# Patient Record
Sex: Male | Born: 1985 | Race: Black or African American | Hispanic: No | Marital: Single | Smoking: Former smoker
Health system: Southern US, Community
[De-identification: ages and names within clinical notes are randomized; demographics above are authoritative.]

---

## 2016-01-09 ENCOUNTER — Emergency Department: Payer: BLUE CROSS/BLUE SHIELD

## 2016-01-09 ENCOUNTER — Emergency Department
Admission: EM | Admit: 2016-01-09 | Discharge: 2016-01-09 | Disposition: A | Discharge status: Home / Self Care | Payer: BLUE CROSS/BLUE SHIELD | Attending: Emergency Medicine | Admitting: Emergency Medicine

## 2016-01-09 ENCOUNTER — Encounter: Payer: Self-pay | Admitting: *Deleted

## 2016-01-09 DIAGNOSIS — K219 Gastro-esophageal reflux disease without esophagitis: Secondary | ICD-10-CM | POA: Diagnosis not present

## 2016-01-09 DIAGNOSIS — Z87891 Personal history of nicotine dependence: Secondary | ICD-10-CM | POA: Diagnosis not present

## 2016-01-09 DIAGNOSIS — R079 Chest pain, unspecified: Secondary | ICD-10-CM | POA: Diagnosis not present

## 2016-01-09 LAB — BASIC METABOLIC PANEL
Anion gap: 8 (ref 5–15)
BUN: 8 mg/dL (ref 6–20)
CHLORIDE: 105 mmol/L (ref 101–111)
CO2: 27 mmol/L (ref 22–32)
Calcium: 9.4 mg/dL (ref 8.9–10.3)
Creatinine, Ser: 0.73 mg/dL (ref 0.61–1.24)
GFR calc non Af Amer: >60 mL/min (ref >60)
GLUCOSE: 86 mg/dL (ref 65–99)
Potassium: 3.6 mmol/L (ref 3.5–5.1)
Sodium: 140 mmol/L (ref 135–145)

## 2016-01-09 LAB — CBC
HEMATOCRIT: 40.1 % (ref 40.0–52.0)
HEMOGLOBIN: 13.6 g/dL (ref 13.0–18.0)
MCH: 31.7 pg (ref 26.0–34.0)
MCHC: 33.8 g/dL (ref 32.0–36.0)
MCV: 93.6 fL (ref 80.0–100.0)
Platelets: 301 10*3/uL (ref 150–440)
RBC: 4.28 MIL/uL — ABNORMAL LOW (ref 4.40–5.90)
RDW: 12.1 % (ref 11.5–14.5)
WBC: 8.4 10*3/uL (ref 3.8–10.6)

## 2016-01-09 LAB — TROPONIN I: Troponin I: <0.03 ng/mL (ref <0.031)

## 2016-01-09 MED ORDER — GI COCKTAIL ~~LOC~~
30.0000 mL | Freq: Once | ORAL | Status: AC
  Administered 2016-01-09: 30 mL via ORAL
  Filled 2016-01-09: qty 30

## 2016-01-09 NOTE — ED Notes (Signed)
Pt to triage with chest pain.  Pt reports pain in center of chest.  Non radiating  Pt took meds for gastric reflux without relief.  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  Pt alert.

## 2016-01-09 NOTE — Discharge Instructions (Signed)
Gastroesophageal Reflux Disease, Adult °Normally, food travels down the esophagus and stays in the stomach to be digested. However, when a person has gastroesophageal reflux disease (GERD), food and stomach acid move back up into the esophagus. When this happens, the esophagus becomes sore and inflamed. Over time, GERD can create small holes (ulcers) in the lining of the esophagus.  °CAUSES °This condition is caused by a problem with the muscle between the esophagus and the stomach (lower esophageal sphincter, or LES). Normally, the LES muscle closes after food passes through the esophagus to the stomach. When the LES is weakened or abnormal, it does not close properly, and that allows food and stomach acid to go back up into the esophagus. The LES can be weakened by certain dietary substances, medicines, and medical conditions, including: °· Tobacco use. °· Pregnancy. °· Having a hiatal hernia. °· Heavy alcohol use. °· Certain foods and beverages, such as coffee, chocolate, onions, and peppermint. °RISK FACTORS °This condition is more likely to develop in: °· People who have an increased body weight. °· People who have connective tissue disorders. °· People who use NSAID medicines. °SYMPTOMS °Symptoms of this condition include: °· Heartburn. °· Difficult or painful swallowing. °· The feeling of having a lump in the throat. °· A bitter taste in the mouth. °· Bad breath. °· Having a large amount of saliva. °· Having an upset or bloated stomach. °· Belching. °· Chest pain. °· Shortness of breath or wheezing. °· Ongoing (chronic) cough or a night-time cough. °· Wearing away of tooth enamel. °· Weight loss. °Different conditions can cause chest pain. Make sure to see your health care provider if you experience chest pain. °DIAGNOSIS °Your health care provider will take a medical history and perform a physical exam. To determine if you have mild or severe GERD, your health care provider may also monitor how you respond  to treatment. You may also have other tests, including: °· An endoscopy to examine your stomach and esophagus with a small camera. °· A test that measures the acidity level in your esophagus. °· A test that measures how much pressure is on your esophagus. °· A barium swallow or modified barium swallow to show the shape, size, and functioning of your esophagus. °TREATMENT °The goal of treatment is to help relieve your symptoms and to prevent complications. Treatment for this condition may vary depending on how severe your symptoms are. Your health care provider may recommend: °· Changes to your diet. °· Medicine. °· Surgery. °HOME CARE INSTRUCTIONS °Diet °· Follow a diet as recommended by your health care provider. This may involve avoiding foods and drinks such as: °¨ Coffee and tea (with or without caffeine). °¨ Drinks that contain alcohol. °¨ Energy drinks and sports drinks. °¨ Carbonated drinks or sodas. °¨ Chocolate and cocoa. °¨ Peppermint and mint flavorings. °¨ Garlic and onions. °¨ Horseradish. °¨ Spicy and acidic foods, including peppers, chili powder, curry powder, vinegar, hot sauces, and barbecue sauce. °¨ Citrus fruit juices and citrus fruits, such as oranges, lemons, and limes. °¨ Tomato-based foods, such as red sauce, chili, salsa, and pizza with red sauce. °¨ Fried and fatty foods, such as donuts, french fries, potato chips, and high-fat dressings. °¨ High-fat meats, such as hot dogs and fatty cuts of red and white meats, such as rib eye steak, sausage, ham, and bacon. °¨ High-fat dairy items, such as whole milk, butter, and cream cheese. °· Eat small, frequent meals instead of large meals. °· Avoid drinking large amounts of liquid with your   meals. °· Avoid eating meals during the 2-3 hours before bedtime. °· Avoid lying down right after you eat. °· Do not exercise right after you eat. ° General Instructions  °· Pay attention to any changes in your symptoms. °· Take over-the-counter and prescription  medicines only as told by your health care provider. Do not take aspirin, ibuprofen, or other NSAIDs unless your health care provider told you to do so. °· Do not use any tobacco products, including cigarettes, chewing tobacco, and e-cigarettes. If you need help quitting, ask your health care provider. °· Wear loose-fitting clothing. Do not wear anything tight around your waist that causes pressure on your abdomen. °· Raise (elevate) the head of your bed 6 inches (15cm). °· Try to reduce your stress, such as with yoga or meditation. If you need help reducing stress, ask your health care provider. °· If you are overweight, reduce your weight to an amount that is healthy for you. Ask your health care provider for guidance about a safe weight loss goal. °· Keep all follow-up visits as told by your health care provider. This is important. °SEEK MEDICAL CARE IF: °· You have new symptoms. °· You have unexplained weight loss. °· You have difficulty swallowing, or it hurts to swallow. °· You have wheezing or a persistent cough. °· Your symptoms do not improve with treatment. °· You have a hoarse voice. °SEEK IMMEDIATE MEDICAL CARE IF: °· You have pain in your arms, neck, jaw, teeth, or back. °· You feel sweaty, dizzy, or light-headed. °· You have chest pain or shortness of breath. °· You vomit and your vomit looks like blood or coffee grounds. °· You faint. °· Your stool is bloody or black. °· You cannot swallow, drink, or eat. °  °This information is not intended to replace advice given to you by your health care provider. Make sure you discuss any questions you have with your health care provider. °  °Document Released: 05/28/2005 Document Revised: 05/09/2015 Document Reviewed: 12/13/2014 °Elsevier Interactive Patient Education ©2016 [employer] °Nonspecific Chest Pain  °Chest pain can be caused by many different conditions. There is always a chance that your pain could be related to something serious, such as a heart  attack or a blood clot in your lungs. Chest pain can also be caused by conditions that are not life-threatening. If you have chest pain, it is very important to follow up with your health care provider. °CAUSES  °Chest pain can be caused by: °· Heartburn. °· Pneumonia or bronchitis. °· Anxiety or stress. °· Inflammation around your heart (pericarditis) or lung (pleuritis or pleurisy). °· A blood clot in your lung. °· A collapsed lung (pneumothorax). It can develop suddenly on its own (spontaneous pneumothorax) or from trauma to the chest. °· Shingles infection (varicella-zoster virus). °· Heart attack. °· Damage to the bones, muscles, and cartilage that make up your chest wall. This can include: °· Bruised bones due to injury. °· Strained muscles or cartilage due to frequent or repeated coughing or overwork. °· Fracture to one or more ribs. °· Sore cartilage due to inflammation (costochondritis). °RISK FACTORS  °Risk factors for chest pain may include: °· Activities that increase your risk for trauma or injury to your chest. °· Respiratory infections or conditions that cause frequent coughing. °· Medical conditions or overeating that can cause heartburn. °· Heart disease or family history of heart disease. °· Conditions or health behaviors that increase your risk of developing a blood clot. °· Having had chicken pox (  varicella zoster). °SIGNS AND SYMPTOMS °Chest pain can feel like: °· Burning or tingling on the surface of your chest or deep in your chest. °· Crushing, pressure, aching, or squeezing pain. °· Dull or sharp pain that is worse when you move, cough, or take a deep breath. °· Pain that is also felt in your back, neck, shoulder, or arm, or pain that spreads to any of these areas. °Your chest pain may come and go, or it may stay constant. °DIAGNOSIS °Lab tests or other studies may be needed to find the cause of your pain. Your health care provider may have you take a test called an ambulatory ECG  (electrocardiogram). An ECG records your heartbeat patterns at the time the test is performed. You may also have other tests, such as: °· Transthoracic echocardiogram (TTE). During echocardiography, sound waves are used to create a picture of all of the heart structures and to look at how blood flows through your heart. °· Transesophageal echocardiogram (TEE). This is a more advanced imaging test that obtains images from inside your body. It allows your health care provider to see your heart in finer detail. °· Cardiac monitoring. This allows your health care provider to monitor your heart rate and rhythm in real time. °· Holter monitor. This is a portable device that records your heartbeat and can help to diagnose abnormal heartbeats. It allows your health care provider to track your heart activity for several days, if needed. °· Stress tests. These can be done through exercise or by taking medicine that makes your heart beat more quickly. °· Blood tests. °· Imaging tests. °TREATMENT  °Your treatment depends on what is causing your chest pain. Treatment may include: °· Medicines. These may include: °· Acid blockers for heartburn. °· Anti-inflammatory medicine. °· Pain medicine for inflammatory conditions. °· Antibiotic medicine, if an infection is present. °· Medicines to dissolve blood clots. °· Medicines to treat coronary artery disease. °· Supportive care for conditions that do not require medicines. This may include: °· Resting. °· Applying heat or cold packs to injured areas. °· Limiting activities until pain decreases. °HOME CARE INSTRUCTIONS °· If you were prescribed an antibiotic medicine, finish it all even if you start to feel better. °· Avoid any activities that bring on chest pain. °· Do not use any tobacco products, including cigarettes, chewing tobacco, or electronic cigarettes. If you need help quitting, ask your health care provider. °· Do not drink alcohol. °· Take medicines only as directed by  your health care provider. °· Keep all follow-up visits as directed by your health care provider. This is important. This includes any further testing if your chest pain does not go away. °· If heartburn is the cause for your chest pain, you may be told to keep your head raised (elevated) while sleeping. This reduces the chance that acid will go from your stomach into your esophagus. °· Make lifestyle changes as directed by your health care provider. These may include: °¨ Getting regular exercise. Ask your health care provider to suggest some activities that are safe for you. °¨ Eating a heart-healthy diet. A registered dietitian can help you to learn healthy eating options. °¨ Maintaining a healthy weight. °¨ Managing diabetes, if necessary. °¨ Reducing stress. °SEEK MEDICAL CARE IF: °· Your chest pain does not go away after treatment. °· You have a rash with blisters on your chest. °· You have a fever. °SEEK IMMEDIATE MEDICAL CARE IF:  °· Your chest pain is worse. °· You   have an increasing cough, or you cough up blood. °· You have severe abdominal pain. °· You have severe weakness. °· You faint. °· You have chills. °· You have sudden, unexplained chest discomfort. °· You have sudden, unexplained discomfort in your arms, back, neck, or jaw. °· You have shortness of breath at any time. °· You suddenly start to sweat, or your skin gets clammy. °· You feel nauseous or you vomit. °· You suddenly feel light-headed or dizzy. °· Your heart begins to beat quickly, or it feels like it is skipping beats. °These symptoms may represent a serious problem that is an emergency. Do not wait to see if the symptoms will go away. Get medical help right away. Call your local emergency services (911 in the [place] Do not drive yourself to the hospital. °  °This information is not intended to replace advice given to you by your health care provider. Make sure you discuss any questions you have with your health care provider. °  °Document  Released: 05/28/2005 Document Revised: 09/08/2014 Document Reviewed: 03/24/2014 °Elsevier Interactive Patient Education ©2016 [employer] ° °

## 2016-01-09 NOTE — ED Provider Notes (Signed)
[facility] Emergency Department Provider Note        Time seen: ----------------------------------------- 7:39 PM on 01/09/2016 -----------------------------------------    I have reviewed the triage vital signs and the nursing notes.   HISTORY  Chief Complaint Chest Pain    HPI Marc Rollins. is a 30 y.o. male who presents ER for chest pain after eating gumbo on Saturday. Patient states his been taking Nexium since Saturday without any relief. Pain is in the center of his chest, nothing makes it better or worse. It is nonradiating, currently works as a Naval architect. Patient denies shortness of breath or other symptoms   No past medical history on file.  There are no active problems to display for this patient.   No past surgical history on file.  Allergies Review of patient's allergies indicates no known allergies.  Social History Social History  Substance Use Topics  . Smoking status: Former Games developer  . Smokeless tobacco: Not on file  . Alcohol Use: Yes    Review of Systems Constitutional: Negative for fever. Eyes: Negative for visual changes. ENT: Negative for sore throat. Cardiovascular: Positive for chest pain Respiratory: Negative for shortness of breath. Gastrointestinal: Negative for abdominal pain, vomiting and diarrhea. Genitourinary: Negative for dysuria. Musculoskeletal: Negative for back pain. Skin: Negative for rash. Neurological: Negative for headaches, focal weakness or numbness.  10-point ROS otherwise negative.  ____________________________________________   PHYSICAL EXAM:  VITAL SIGNS: ED Triage Vitals  Enc Vitals Group     BP 01/09/16 1803 122/48 mmHg     Pulse Rate 01/09/16 1803 73     Resp 01/09/16 1803 20     Temp 01/09/16 1803 98.6 F (37 C)     Temp Source 01/09/16 1803 Oral     SpO2 01/09/16 1803 99 %     Weight 01/09/16 1803 280 lb (127.007 kg)     Height 01/09/16 1803  (1.6 m)     Head  Cir --      Peak Flow --      Pain Score 01/09/16 1804 5     Pain Loc --      Pain Edu? --      Excl. in GC? --     Constitutional: Alert and oriented. Well appearing and in no distress. Eyes: Conjunctivae are normal. PERRL. Normal extraocular movements. ENT   Head: Normocephalic and atraumatic.   Nose: No congestion/rhinnorhea.   Mouth/Throat: Mucous membranes are moist.   Neck: No stridor. Cardiovascular: Normal rate, regular rhythm. No murmurs, rubs, or gallops. Respiratory: Normal respiratory effort without tachypnea nor retractions. Breath sounds are clear and equal bilaterally. No wheezes/rales/rhonchi. Gastrointestinal: Soft and nontender. Normal bowel sounds Musculoskeletal: Nontender with normal range of motion in all extremities. No lower extremity tenderness nor edema. Neurologic:  Normal speech and language. No gross focal neurologic deficits are appreciated.  Skin:  Skin is warm, dry and intact. No rash noted. Psychiatric: Mood and affect are normal. Speech and behavior are normal.  ____________________________________________  EKG: Interpreted by me. Normal sinus rhythm with rate of 72 bpm, normal PR interval, normal QRS, normal QT interval. Normal axis.  ____________________________________________  ED COURSE:  Pertinent labs & imaging results that were available during my care of the patient were reviewed by me and considered in my medical decision making (see chart for details). Patient is in no acute distress, likely GERD symptoms. I will check basic labs, give GI cocktail and reevaluate. ____________________________________________    LABS (pertinent  positives/negatives)  Labs Reviewed  CBC - Abnormal; Notable for the following:    RBC 4.28 (*)    All other components within normal limits  BASIC METABOLIC PANEL  TROPONIN I    RADIOLOGY Images were viewed by me Chest x-ray  IMPRESSION: No active cardiopulmonary  disease. ____________________________________________  FINAL ASSESSMENT AND PLAN  Chest pain, GERD  Plan: Patient with labs and imaging as dictated above. Patient presents to ER with chest pain which is likely GERD related. He was given a GI cocktail and will be encouraged to continue taking Nexium. He is stable for outpatient follow-up.   Emily FilbertWilliams, Jonathan E, MD   Note: This dictation was prepared with Dragon dictation. Any transcriptional errors that result from this process are unintentional   Emily FilbertJonathan E Williams, MD 01/09/16 2012

## 2016-01-09 NOTE — ED Notes (Signed)
Pt states he started having chest pain after eating "gumbo" on Saturday. Pt states he has been taking nexium since Saturday without relief.

## 2016-10-24 IMAGING — CR DG CHEST 2V
1 series · 2 of 2 positions shown · non-contrast
Comparison: None.

CLINICAL DATA: Chest pain

EXAM:
CHEST  2 VIEW

[Series 1: dg chest 2 view · 0.14mm/px · 2 of 2 slices shown]
[im 1/2]
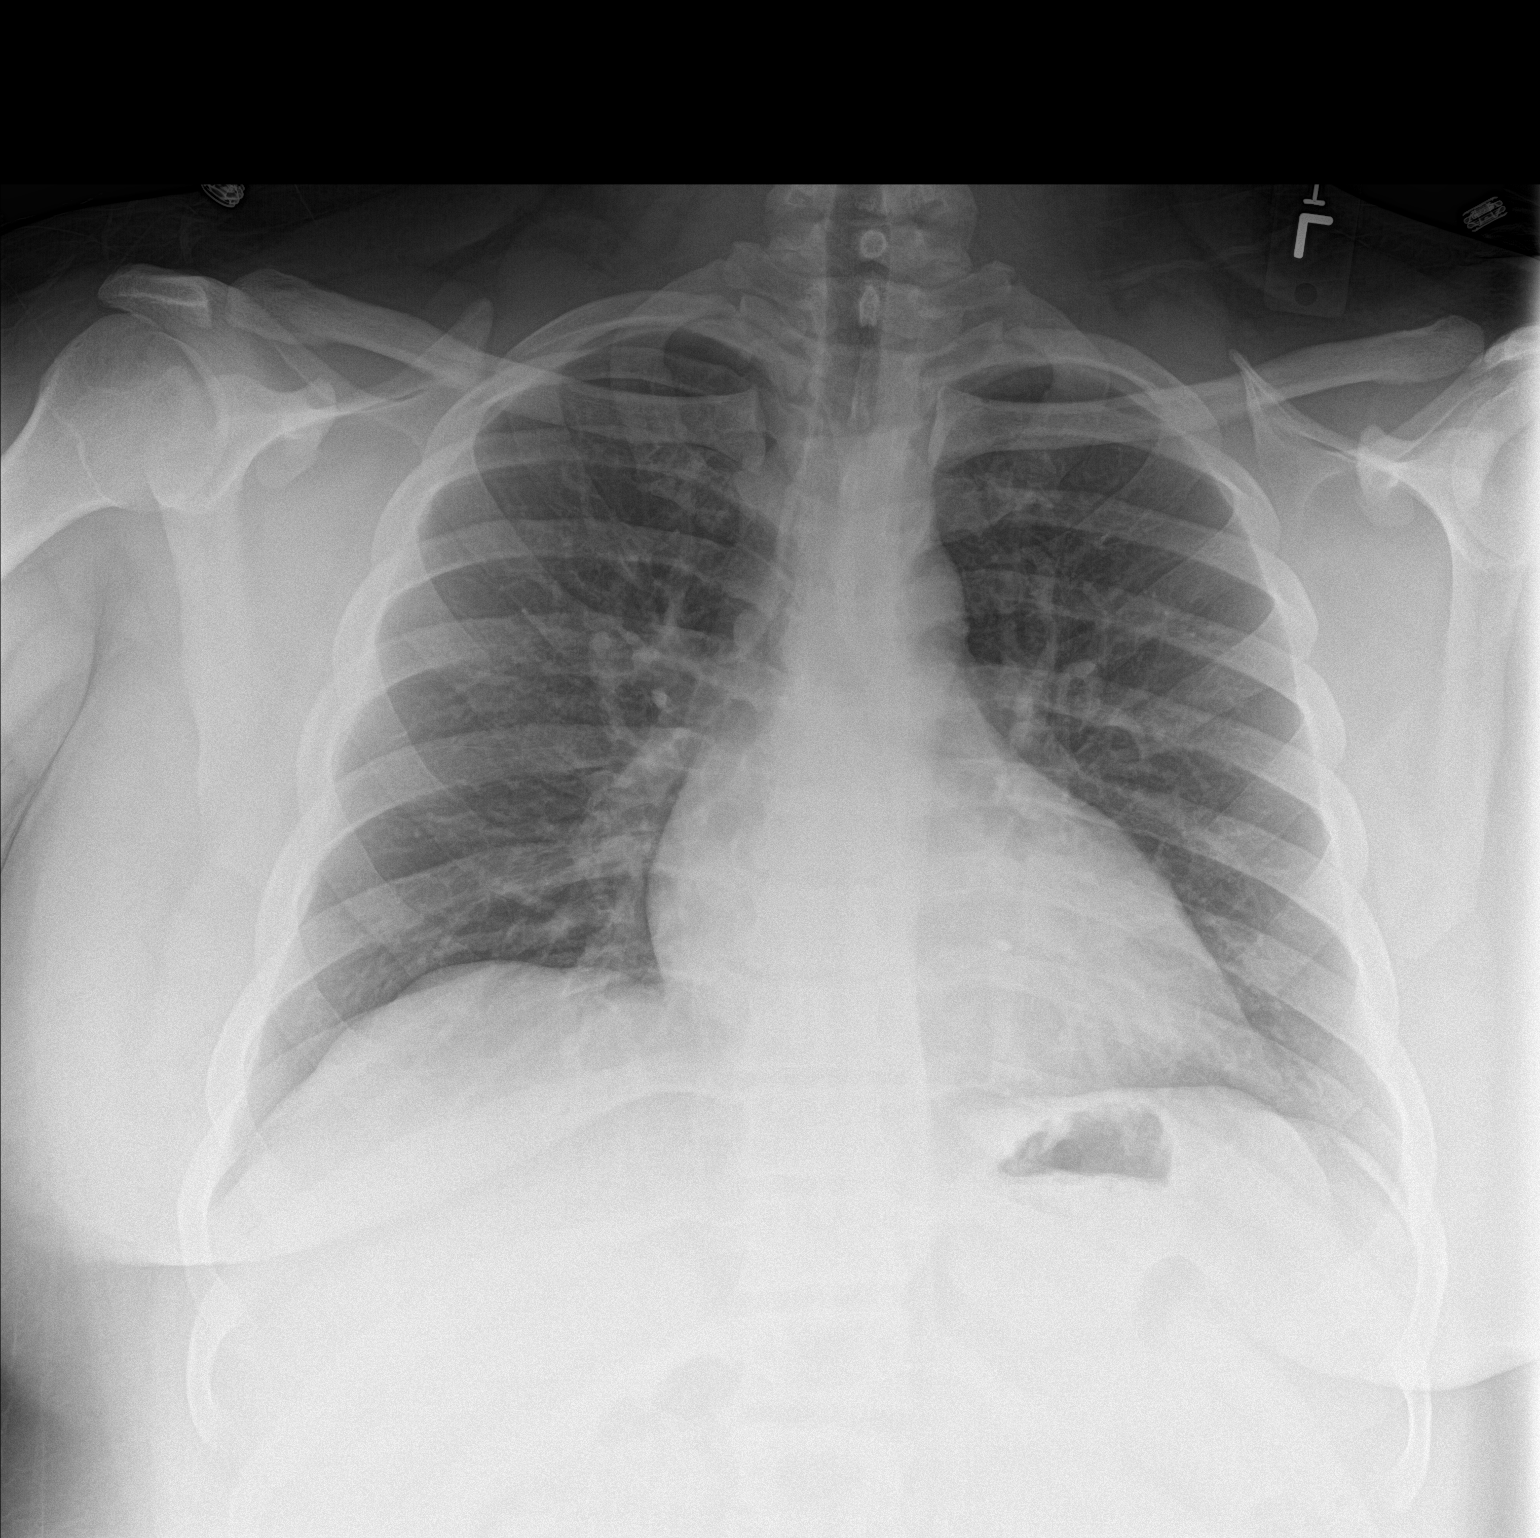
[im 2/2]
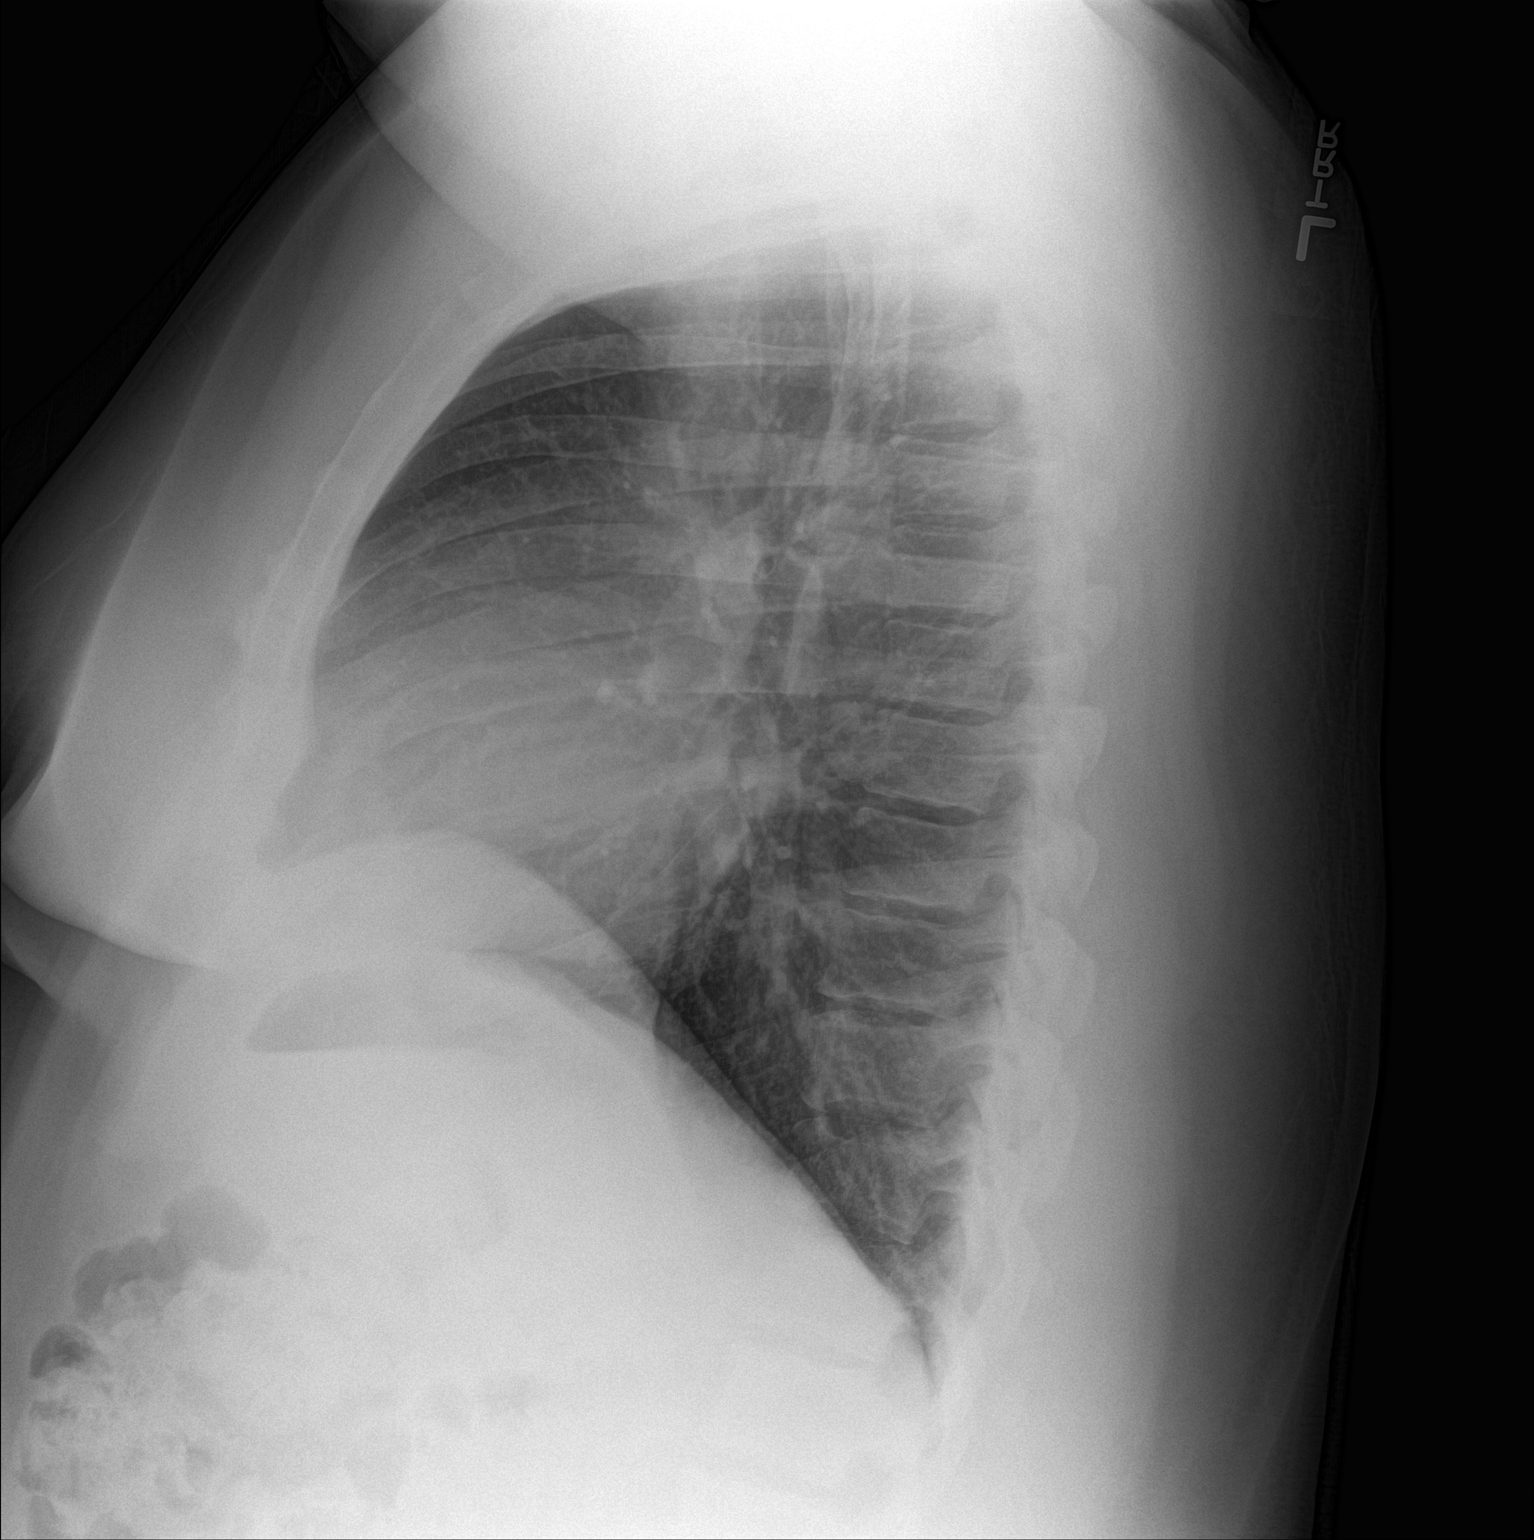

[2 of 2 positions shown; findings below may reference images not displayed]

FINDINGS: Cardiomediastinal silhouette is unremarkable. No acute infiltrate or
pleural effusion. No pulmonary edema. Bony thorax is unremarkable.
IMPRESSION: No active cardiopulmonary disease.
